# Patient Record
Sex: Female | Born: 1995 | Race: Black or African American | Hispanic: No | Marital: Single | Smoking: Current some day smoker
Health system: Southern US, Community
[De-identification: ages and names within clinical notes are randomized; demographics above are authoritative.]

## PROBLEM LIST (undated history)

## (undated) DIAGNOSIS — R4020 Unspecified coma: Secondary | ICD-10-CM

## (undated) HISTORY — DX: Unspecified coma: R40.20

---
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
## 2020-02-21 ENCOUNTER — Ambulatory Visit: Payer: Self-pay | Attending: Internal Medicine

## 2020-06-26 ENCOUNTER — Encounter (HOSPITAL_COMMUNITY): Payer: Self-pay | Admitting: Emergency Medicine

## 2020-06-26 ENCOUNTER — Other Ambulatory Visit: Payer: Self-pay

## 2020-06-26 ENCOUNTER — Ambulatory Visit (HOSPITAL_COMMUNITY)
Admission: EM | Admit: 2020-06-26 | Discharge: 2020-06-26 | Disposition: A | Discharge status: Home / Self Care | Payer: 59 | Attending: Family Medicine | Admitting: Family Medicine

## 2020-06-26 DIAGNOSIS — Z113 Encounter for screening for infections with a predominantly sexual mode of transmission: Secondary | ICD-10-CM | POA: Diagnosis present

## 2020-06-26 DIAGNOSIS — R300 Dysuria: Secondary | ICD-10-CM | POA: Diagnosis not present

## 2020-06-26 DIAGNOSIS — N3090 Cystitis, unspecified without hematuria: Secondary | ICD-10-CM | POA: Diagnosis not present

## 2020-06-26 DIAGNOSIS — Z3202 Encounter for pregnancy test, result negative: Secondary | ICD-10-CM

## 2020-06-26 LAB — POCT URINALYSIS DIPSTICK, ED / UC
Bilirubin Urine: NEGATIVE
Glucose, UA: 100 mg/dL — AB
Ketones, ur: NEGATIVE mg/dL
Nitrite: POSITIVE — AB
Protein, ur: >=300 mg/dL — AB
Specific Gravity, Urine: 1.025 (ref 1.005–1.030)
Urobilinogen, UA: 1 mg/dL (ref 0.0–1.0)
pH: 7 (ref 5.0–8.0)

## 2020-06-26 LAB — POC URINE PREG, ED: Preg Test, Ur: NEGATIVE

## 2020-06-26 MED ORDER — CEPHALEXIN 500 MG PO CAPS
500.0000 mg | ORAL_CAPSULE | Freq: Two times a day (BID) | ORAL | 0 refills | Status: DC
Start: 2020-06-26 — End: 2020-10-07

## 2020-06-26 NOTE — ED Provider Notes (Signed)
[facility]    CSN: [id number] Arrival date & time: 06/26/20  6384      History   Chief Complaint Chief Complaint  Patient presents with  . Dysuria    HPI Robin Lara is a 24 y.o. female.   HPI  Pleasant young woman.  Thinks she has a bladder infection.  Dysuria, frequency, and some blood in her urine.  She has had this before. No flank pain, abdominal pain, fever or chills.  No nausea or vomiting.  No history of kidney stones or kidney infection Patient states she is not pregnant She does not have any vaginal discharge but would like to be tested for STI infections while she is here  History reviewed. No pertinent past medical history.  There are no problems to display for this patient.   History reviewed. No pertinent surgical history.  OB History   No obstetric history on file.      Home Medications    Prior to Admission medications   Medication Sig Start Date End Date Taking? Authorizing Provider  Levonorgestrel (KYLEENA IU) by Intrauterine route.   Yes [provider]  cephALEXin (KEFLEX) 500 MG capsule Take 1 capsule (500 mg total) by mouth 2 (two) times daily. 06/26/20   Eustace Moore, MD    Family History Family History  Problem Relation Age of Onset  . Healthy Mother   . Healthy Father     Social History Social History   Tobacco Use  . Smoking status: Never Smoker  Substance Use Topics  . Alcohol use: No  . Drug use: Not on file     Allergies   Patient has no known allergies.   Review of Systems Review of Systems  See HPI Physical Exam Triage Vital Signs ED Triage Vitals  Enc Vitals Group     BP 06/26/20 0856 125/78     Pulse Rate 06/26/20 0856 88     Resp 06/26/20 0856 18     Temp 06/26/20 0856 98.5 F (36.9 C)     Temp Source 06/26/20 0856 Oral     SpO2 06/26/20 0856 99 %     Weight --      Height --      Head Circumference --      Peak Flow --      Pain Score 06/26/20 0854 0     Pain  Loc --      Pain Edu? --      Excl. in GC? --    No data found.  Updated Vital Signs BP 125/78 (BP Location: Right Arm)   Pulse 88   Temp 98.5 F (36.9 C) (Oral)   Resp 18   LMP 06/11/2020 (Approximate)   SpO2 99%      Physical Exam Constitutional:      General: She is not in acute distress.    Appearance: She is well-developed.  HENT:     Head: Normocephalic and atraumatic.     Mouth/Throat:     Comments: Mask is in place Eyes:     Conjunctiva/sclera: Conjunctivae normal.     Pupils: Pupils are equal, round, and reactive to light.  Cardiovascular:     Rate and Rhythm: Normal rate.  Pulmonary:     Effort: Pulmonary effort is normal. No respiratory distress.  Abdominal:     General: There is no distension.     Palpations: Abdomen is soft.     Tenderness: There is no abdominal tenderness. There is  no right CVA tenderness or left CVA tenderness.  Musculoskeletal:        General: Normal range of motion.     Cervical back: Normal range of motion.  Skin:    General: Skin is warm and dry.  Neurological:     Mental Status: She is alert.  Psychiatric:        Mood and Affect: Mood normal.        Behavior: Behavior normal.      UC Treatments / Results  Labs (all labs ordered are listed, but only abnormal results are displayed) Labs Reviewed  POCT URINALYSIS DIPSTICK, ED / UC - Abnormal; Notable for the following components:      Result Value   Glucose, UA 100 (*)    Hgb urine dipstick LARGE (*)    Protein, ur >=300 (*)    Nitrite POSITIVE (*)    Leukocytes,Ua LARGE (*)    All other components within normal limits  URINE CULTURE  POC URINE PREG, ED  CERVICOVAGINAL ANCILLARY ONLY    EKG   Radiology No results found.  Procedures Procedures (including critical care time)  Medications Ordered in UC Medications - No data to display  Initial Impression / Assessment and Plan / UC Course  I have reviewed the triage vital signs and the nursing  notes.  Pertinent labs & imaging results that were available during my care of the patient were reviewed by me and considered in my medical decision making (see chart for details).     Urine cultures pending.  Vaginal swab is pending.  I am going to start her on cephalexin 2 times a day.  Push fluids.  Check results in MyChart Final Clinical Impressions(s) / UC Diagnoses   Final diagnoses:  Cystitis  Screen for STD (sexually transmitted disease)     Discharge Instructions     Take antibiotic 2 times a day Drink plenty of water Check my chart for your swab results You will be called if any of your test results are positive   ED Prescriptions    Medication Sig Dispense Auth. Provider   cephALEXin (KEFLEX) 500 MG capsule Take 1 capsule (500 mg total) by mouth 2 (two) times daily. 10 capsule Eustace Moore, MD     PDMP not reviewed this encounter.   Eustace Moore, MD 06/26/20 [id number]

## 2020-06-26 NOTE — ED Triage Notes (Signed)
Present presents with dysuria, minimal blood in urine 5 days ago.   Denies discharge, itching. States she has been taking AZO since Monday. States did feel relief. Sx started 5 days ago.   States would like to be tested for UTI and STIs.

## 2020-06-26 NOTE — Discharge Instructions (Signed)
Take antibiotic 2 times a day Drink plenty of water Check my chart for your swab results You will be called if any of your test results are positive

## 2020-06-27 LAB — CERVICOVAGINAL ANCILLARY ONLY
Chlamydia: NEGATIVE
Comment: NEGATIVE
Comment: NEGATIVE
Comment: NORMAL
Neisseria Gonorrhea: NEGATIVE
Trichomonas: NEGATIVE

## 2020-06-28 LAB — URINE CULTURE
Culture: >=100000 — AB
Special Requests: NORMAL

## 2020-09-27 ENCOUNTER — Ambulatory Visit (HOSPITAL_COMMUNITY)
Admission: EM | Admit: 2020-09-27 | Discharge: 2020-09-27 | Disposition: A | Discharge status: Nursing Home (Includes ICF) | Payer: 59

## 2020-09-27 ENCOUNTER — Encounter (HOSPITAL_COMMUNITY): Payer: Self-pay

## 2020-09-27 ENCOUNTER — Emergency Department (HOSPITAL_COMMUNITY): Payer: 59

## 2020-09-27 ENCOUNTER — Other Ambulatory Visit: Payer: Self-pay

## 2020-09-27 ENCOUNTER — Emergency Department (HOSPITAL_COMMUNITY)
Admission: EM | Admit: 2020-09-27 | Discharge: 2020-09-27 | Disposition: A | Discharge status: Home / Self Care | Payer: 59 | Attending: Emergency Medicine | Admitting: Emergency Medicine

## 2020-09-27 DIAGNOSIS — R55 Syncope and collapse: Secondary | ICD-10-CM | POA: Insufficient documentation

## 2020-09-27 DIAGNOSIS — R4020 Unspecified coma: Secondary | ICD-10-CM

## 2020-09-27 LAB — URINALYSIS, ROUTINE W REFLEX MICROSCOPIC
Bilirubin Urine: NEGATIVE
Glucose, UA: NEGATIVE mg/dL
Hgb urine dipstick: NEGATIVE
Ketones, ur: NEGATIVE mg/dL
Leukocytes,Ua: NEGATIVE
Nitrite: NEGATIVE
Protein, ur: NEGATIVE mg/dL
Specific Gravity, Urine: 1.018 (ref 1.005–1.030)
pH: 6 (ref 5.0–8.0)

## 2020-09-27 LAB — I-STAT BETA HCG BLOOD, ED (MC, WL, AP ONLY): I-stat hCG, quantitative: <5 m[IU]/mL (ref <5)

## 2020-09-27 LAB — CBC
HCT: 36.1 % (ref 36.0–46.0)
Hemoglobin: 11.2 g/dL — ABNORMAL LOW (ref 12.0–15.0)
MCH: 23 pg — ABNORMAL LOW (ref 26.0–34.0)
MCHC: 31 g/dL (ref 30.0–36.0)
MCV: 74 fL — ABNORMAL LOW (ref 80.0–100.0)
Platelets: 265 10*3/uL (ref 150–400)
RBC: 4.88 MIL/uL (ref 3.87–5.11)
RDW: 15.3 % (ref 11.5–15.5)
WBC: 4.4 10*3/uL (ref 4.0–10.5)
nRBC: 0 % (ref 0.0–0.2)

## 2020-09-27 LAB — BASIC METABOLIC PANEL
Anion gap: 9 (ref 5–15)
BUN: 12 mg/dL (ref 6–20)
CO2: 25 mmol/L (ref 22–32)
Calcium: 9.1 mg/dL (ref 8.9–10.3)
Chloride: 103 mmol/L (ref 98–111)
Creatinine, Ser: 0.88 mg/dL (ref 0.44–1.00)
GFR, Estimated: >60 mL/min (ref >60)
Glucose, Bld: 93 mg/dL (ref 70–99)
Potassium: 4.1 mmol/L (ref 3.5–5.1)
Sodium: 137 mmol/L (ref 135–145)

## 2020-09-27 LAB — CBG MONITORING, ED: Glucose-Capillary: 89 mg/dL (ref 70–99)

## 2020-09-27 NOTE — ED Triage Notes (Signed)
Patient reports that she was at concert last night and family reports that she had syncopal event at the event, states that she hit her head on chair and now has head pain and back pain related to event. On assessment alert and oriented.

## 2020-09-27 NOTE — ED Provider Notes (Signed)
[facility] EMERGENCY DEPARTMENT Provider Note   CSN: [id number] Arrival date & time: 09/27/20  1059     History No chief complaint on file.   Robin Lara is a 24 y.o. female with no pertinent past medical history who presents today for evaluation after a syncopal event. Last night she was at a concert with her.  She states that she had "a few" mushrooms, and 2 drinks.  She states that that happened about 2 hours before she passed out.  About half an hour before hand she had smoked marijuana.  She states that the using mushrooms it is new but others are not.    With patient's permission I spoke with her aunt [place] [phone].  She reports that her back was towards the patient when she saw her fall.  She states that for under a minute patient had twitching of her whole body.  She was not confused after, did not loose control of bowel or bladder function.  She Hit her head on a chair and on the ground.  She was checked out by medical staff after who gave her water and said she was probably dehydrated.   Patient states that she went to urgent care.  She was referred here.  She reports that she has no seizure history.  She started to feel odd beforehand, light headed.  She denies any recent sickness.  No leg swelling, surgeries or immobilization.  No personal history of DVT/PE.  No recent long travels.  No hemoptysis.  She denies any chest pain.  No palpitations.  No shortness of breath.    HPI     History reviewed. No pertinent past medical history.  There are no problems to display for this patient.   History reviewed. No pertinent surgical history.   OB History   No obstetric history on file.     Family History  Problem Relation Age of Onset  . Healthy Mother   . Healthy Father     Social History   Tobacco Use  . Smoking status: Never Smoker  Substance Use Topics  . Alcohol use: No  . Drug use: Not on file    Home  Medications Prior to Admission medications   Medication Sig Start Date End Date Taking? Authorizing Provider  cephALEXin (KEFLEX) 500 MG capsule Take 1 capsule (500 mg total) by mouth 2 (two) times daily. 06/26/20   Eustace Moore, MD  Levonorgestrel (KYLEENA IU) by Intrauterine route.    [provider]    Allergies    Patient has no known allergies.  Review of Systems   Review of Systems  Constitutional: Negative for chills, fatigue and fever.  HENT: Negative for congestion.   Eyes: Negative for visual disturbance.  Respiratory: Negative for cough, choking and shortness of breath.   Cardiovascular: Negative for chest pain.  Gastrointestinal: Negative for abdominal pain, diarrhea, nausea and vomiting.  Genitourinary: Negative for dysuria.  Musculoskeletal: Negative for back pain and neck pain.  Skin: Negative for color change and wound.  Neurological: Positive for seizures, syncope and headaches. Negative for weakness.  Psychiatric/Behavioral: Negative for confusion.  All other systems reviewed and are negative.   Physical Exam Updated Vital Signs BP 122/72   Pulse 85   Temp 98.4 F (36.9 C) (Oral)   Resp 15   SpO2 100%   Physical Exam Vitals and nursing note reviewed.  Constitutional:      General: She is not in acute distress.  Appearance: She is well-developed. She is not ill-appearing.  HENT:     Head: Normocephalic and atraumatic.     Comments: No racoon eyes or battle signs bilaterally.  Unable to appreciate significant contusion.   Eyes:     Conjunctiva/sclera: Conjunctivae normal.  Cardiovascular:     Rate and Rhythm: Normal rate and regular rhythm.     Pulses: Normal pulses.     Heart sounds: Normal heart sounds. No murmur heard.   Pulmonary:     Effort: Pulmonary effort is normal. No respiratory distress.     Breath sounds: Normal breath sounds.  Abdominal:     Palpations: Abdomen is soft.     Tenderness: There is no abdominal  tenderness.  Musculoskeletal:     Cervical back: Normal range of motion and neck supple. Tenderness (Midline lower C-spine) present.     Right lower leg: No edema.     Left lower leg: No edema.  Skin:    General: Skin is warm and dry.  Neurological:     General: No focal deficit present.     Mental Status: She is alert and oriented to person, place, and time.     Cranial Nerves: No cranial nerve deficit.     Sensory: No sensory deficit.     Motor: No weakness.     Coordination: Coordination normal.  Psychiatric:        Mood and Affect: Mood normal.        Behavior: Behavior normal.     ED Results / Procedures / Treatments   Labs (all labs ordered are listed, but only abnormal results are displayed) Labs Reviewed  CBC - Abnormal; Notable for the following components:      Result Value   Hemoglobin 11.2 (*)    MCV 74.0 (*)    MCH 23.0 (*)    All other components within normal limits  BASIC METABOLIC PANEL  URINALYSIS, ROUTINE W REFLEX MICROSCOPIC  CBG MONITORING, ED  I-STAT BETA HCG BLOOD, ED ([facility], [facility], [facility] ONLY)    EKG EKG Interpretation  Date/Time:  Saturday September 27 2020 11:35:55 EST Ventricular Rate:  78 PR Interval:  146 QRS Duration: 78 QT Interval:  368 QTC Calculation: 419 R Axis:   69 Text Interpretation: Normal sinus rhythm Normal ECG No previous ECGs available Confirmed by Richardean Canal [phone]) on 09/27/2020 2:59:49 PM   Radiology CT Head Wo Contrast  Result Date: 09/27/2020 CLINICAL DATA:  Syncopal episode EXAM: CT HEAD WITHOUT CONTRAST CT CERVICAL SPINE WITHOUT CONTRAST TECHNIQUE: Multidetector CT imaging of the head and cervical spine was performed following the standard protocol without intravenous contrast. Multiplanar CT image reconstructions of the cervical spine were also generated. COMPARISON:  None. FINDINGS: CT HEAD FINDINGS Brain: No evidence of acute infarction, hemorrhage, hydrocephalus, extra-axial collection or mass lesion/mass effect.  Vascular: No hyperdense vessel or unexpected calcification. Skull: Normal. Negative for fracture or focal lesion. Sinuses/Orbits: Mucosal thickening of the LEFT sphenoid sinus Other: None. CT CERVICAL SPINE FINDINGS Alignment: Mild reversal of cervical lordosis. No evidence of spondylolisthesis. Skull base and vertebrae: No acute fracture. No primary bone lesion or focal pathologic process. Soft tissues and spinal canal: No prevertebral fluid or swelling. No visible canal hematoma. Disc levels:  No significant degenerative changes. Upper chest: Negative. Other: None IMPRESSION: 1. No acute intracranial abnormality. 2. No acute fracture or subluxation of the cervical spine. Electronically Signed   By: Meda Klinefelter MD   On: 09/27/2020 16:17   CT Cervical Spine  Wo Contrast  Result Date: 09/27/2020 CLINICAL DATA:  Syncopal episode EXAM: CT HEAD WITHOUT CONTRAST CT CERVICAL SPINE WITHOUT CONTRAST TECHNIQUE: Multidetector CT imaging of the head and cervical spine was performed following the standard protocol without intravenous contrast. Multiplanar CT image reconstructions of the cervical spine were also generated. COMPARISON:  None. FINDINGS: CT HEAD FINDINGS Brain: No evidence of acute infarction, hemorrhage, hydrocephalus, extra-axial collection or mass lesion/mass effect. Vascular: No hyperdense vessel or unexpected calcification. Skull: Normal. Negative for fracture or focal lesion. Sinuses/Orbits: Mucosal thickening of the LEFT sphenoid sinus Other: None. CT CERVICAL SPINE FINDINGS Alignment: Mild reversal of cervical lordosis. No evidence of spondylolisthesis. Skull base and vertebrae: No acute fracture. No primary bone lesion or focal pathologic process. Soft tissues and spinal canal: No prevertebral fluid or swelling. No visible canal hematoma. Disc levels:  No significant degenerative changes. Upper chest: Negative. Other: None IMPRESSION: 1. No acute intracranial abnormality. 2. No acute fracture or  subluxation of the cervical spine. Electronically Signed   By: Meda KlinefelterStephanie  Peacock MD   On: 09/27/2020 16:17    Procedures Procedures (including critical care time)  Medications Ordered in ED Medications - No data to display  ED Course  I have reviewed the triage vital signs and the nursing notes.  Pertinent labs & imaging results that were available during my care of the patient were reviewed by me and considered in my medical decision making (see chart for details).  No data found.     MDM Rules/Calculators/A&P                          Patient is a otherwise healthy 24 year old who presents today for evaluation after a syncopal event.  This happened last night when she was at a concert after she had reportedly 2 shots of alcohol, smoke marijuana and used mushrooms.  On exam she is generally well-appearing.  EKG obtained without significant arrhythmia or ischemia.  Labs obtained and reviewed, she has mild anemia however not enough that I suspect it to be clinically significant or related to her syncopal event. With patient's permission I spoke with her aunt who reports that patient had twitching motions during the syncopal event.    With a possible first-time seizure-like activity versus seizure from striking her head and midline neck pain CT head and neck are obtained without evidence of intracranial hemorrhage, intracranial mass, or acute abnormalities.  Patient is given seizure/syncope precautions.  She is instructed that she should not drive until cleared to do so by neurology.  We additionally discussed avoiding other potentially dangerous activities where she may cause harm to herself or someone else if she were to lose consciousness or have a seizure.  She states her understanding of these instructions and precautions.  Referral placed for ambulatory referral for neurology.  Return precautions were discussed with patient who states their understanding.  At the time of discharge  patient denied any unaddressed complaints or concerns.  Patient is agreeable for discharge home.  Note: Portions of this report may have been transcribed using voice recognition software. Every effort was made to ensure accuracy; however, inadvertent computerized transcription errors may be present.  Final Clinical Impression(s) / ED Diagnoses Final diagnoses:  Loss of consciousness (HCC)    Rx / DC Orders ED Discharge Orders         Ordered    Ambulatory referral to Neurology       Comments: An appointment is requested in approximately:  2 weeks   09/27/20 1633           Cristina Gong, PA-C 09/27/20 1704    Charlynne Pander, MD 09/27/20 [phone]

## 2020-09-27 NOTE — Discharge Instructions (Signed)
Today your blood work and CT scan on your head and neck was overall reassuring. Given that you had twitching movements I would recommend that you follow-up with neurology. Do not drive, operate heavy machinery or perform other potentially dangerous tasks until you are cleared to do so due to concern of this being a possibly first-time seizure. Do not swim, or bathe in a tub.  Do not work at heights or perform any activity where if you pass out or have a seizure you may cause harm to your self or anyone else.    Please take Tylenol (acetaminophen) to relieve your pain.  You may take tylenol, up to 1,000 mg (two extra strength pills).  Do not take more than 3,000 mg tylenol in a 24 hour period.  Please check all medication labels as many medications such as pain and cold medications may contain tylenol. Please do not drink alcohol while taking this medication.

## 2020-09-27 NOTE — ED Notes (Signed)
Pt reports having syncopal episode at concert last night and "doesn't remember anything" about incident.  States was checked by paramedic at concert.  Denies any drug use; states had had "a couple shots" of liquor.  Discussed with Colin Mulders, NP - informed pt if she had syncopal episode without memory of incident, needs to be seen in ED.  Pt verbalized understanding.

## 2020-10-07 ENCOUNTER — Encounter: Payer: Self-pay | Admitting: Diagnostic Neuroimaging

## 2020-10-07 ENCOUNTER — Other Ambulatory Visit: Payer: Self-pay

## 2020-10-07 ENCOUNTER — Ambulatory Visit: Payer: 59 | Admitting: Diagnostic Neuroimaging

## 2020-10-07 VITALS — BP 123/82 | HR 79 | Ht 65.0 in | Wt 155.0 lb

## 2020-10-07 DIAGNOSIS — R55 Syncope and collapse: Secondary | ICD-10-CM

## 2020-10-07 NOTE — Progress Notes (Signed)
[facility]  PATIENT: Robin Lara DOB: 03/21/1996  REFERRING CLINICIAN: Cristina Gong, [place]* HISTORY FROM: patient  REASON FOR VISIT: new consult    HISTORICAL  CHIEF COMPLAINT:  Chief Complaint  Patient presents with  . Loss of Consciousness    rm 6 New Pt "09/27/20 loss of consciousness, hit head after going to concert, ongoing headache since"     HISTORY OF PRESENT ILLNESS:   24 year old female here for evaluation of syncope.  09/26/20 patient was at an indoor concert for about 1 hour when she began to feel lightheaded and dizzy.  Apparently she collapsed to the ground and lost consciousness for about 30 seconds.  She may have hit her head.  She had some brief twitching movements.  She woke up rapidly without postictal confusion.  No tongue biting or incontinence.  She drank some water and was attended to by paramedics.  She was able to stay at venue for the remainder of the concert.  Before she passed out she had 2 shots of alcohol, some cannabis and some mushrooms.  Patient had not eaten or drank that much earlier in the day.  Next day patient had some ongoing headaches and therefore was taken to the emergency room for evaluation.  CT of the head and neck were unremarkable.  Lab testing unremarkable.  No prior similar events.  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    REVIEW OF SYSTEMS: Full 14 system review of systems performed and negative with exception of: As per HPI.  ALLERGIES: No Known Allergies  HOME MEDICATIONS: Outpatient Medications Prior to Visit  Medication Sig Dispense Refill  . Levonorgestrel (KYLEENA IU) by Intrauterine route.    . cephALEXin (KEFLEX) 500 MG capsule Take 1 capsule (500 mg total) by mouth 2 (two) times daily. 10 capsule 0   No facility-administered medications prior to visit.    PAST MEDICAL HISTORY: Past Medical History:  Diagnosis Date  . Loss of consciousness  (HCC)     PAST SURGICAL HISTORY: No past surgical history on file.  FAMILY HISTORY: Family History  Problem Relation Age of Onset  . Healthy Mother   . Healthy Father     SOCIAL HISTORY: Social History   Socioeconomic History  . Marital status: Single    Spouse name: Not on file  . Number of children: 0  . Years of education: Not on file  . Highest education level: Bachelor's degree (e.g., BA, AB, BS)  Occupational History    Comment: grad school  Tobacco Use  . Smoking status: Current Some Day Smoker  . Smokeless tobacco: Never Used  Substance and Sexual Activity  . Alcohol use: Yes    Comment: 10/07/20 daily  . Drug use: Yes    Types: Marijuana    Comment:  10/07/20 daily  . Sexual activity: Not on file  Other Topics Concern  . Not on file  Social History Narrative   Caffeine- maybe 1 green tea or coffee once a week   Social Determinants of Health   Financial Resource Strain:   . Difficulty of Paying Living Expenses: Not on file  Food Insecurity:   . Worried About Programme researcher, broadcasting/film/video in the Last Year: Not on file  . Ran Out of Food in the Last Year: Not on file  Transportation Needs:   . Lack of Transportation (Medical): Not on file  . Lack of Transportation (Non-Medical): Not on file  Physical Activity:   . Days of Exercise per Week: Not on file  . Minutes of Exercise per Session: Not on file  Stress:   . Feeling of Stress : Not on file  Social Connections:   . Frequency of Communication with Friends and Family: Not on file  . Frequency of Social Gatherings with Friends and Family: Not on file  . Attends Religious Services: Not on file  . Active Member of Clubs or Organizations: Not on file  . Attends Banker Meetings: Not on file  . Marital Status: Not on file  Intimate Partner Violence:   . Fear of Current or Ex-Partner: Not on file  . Emotionally Abused: Not on file  . Physically Abused: Not on file  . Sexually Abused: Not on file      PHYSICAL EXAM  GENERAL EXAM/CONSTITUTIONAL: Vitals:  Vitals:   10/07/20 1113  BP: 123/82  Pulse: 79  Weight: 155 lb (70.3 kg)  Height: 5\' 5"  (1.651 m)     Body mass index is 25.79 kg/m. Wt Readings from Last 3 Encounters:  10/07/20 155 lb (70.3 kg)  06/18/14 137 lb (62.1 kg) (72 %, Z= 0.59)*  08/01/13 130 lb 12.8 oz (59.3 kg) (66 %, Z= 0.43)*   * Growth percentiles are based on CDC (Girls, 2-20 Years) data.     Patient is in no distress; well developed, nourished and groomed; neck is supple  CARDIOVASCULAR:  Examination of carotid arteries is normal; no carotid bruits  Regular rate and rhythm, no murmurs  Examination of peripheral vascular system by observation and palpation is normal  EYES:  Ophthalmoscopic exam of optic discs and posterior segments is normal; no papilledema or hemorrhages  No exam data present  MUSCULOSKELETAL:  Gait, strength, tone, movements noted in Neurologic exam below  NEUROLOGIC: MENTAL STATUS:  No flowsheet data found.  awake, alert, oriented to person, place and time  recent and remote memory intact  normal attention and concentration  language fluent, comprehension intact, naming intact  fund of knowledge appropriate  CRANIAL NERVE:   2nd - no papilledema on fundoscopic exam  2nd, 3rd, 4th, 6th - pupils equal and reactive to light, visual fields full to confrontation, extraocular muscles intact, no nystagmus  5th - facial sensation symmetric  7th - facial strength symmetric  8th - hearing intact  9th - palate elevates symmetrically, uvula midline  11th - shoulder shrug symmetric  12th - tongue protrusion midline  MOTOR:   normal bulk and tone, full strength in the BUE, BLE  SENSORY:   normal and symmetric to light touch, temperature, vibration  COORDINATION:   finger-nose-finger, fine finger movements normal  REFLEXES:   deep tendon reflexes present and symmetric  GAIT/STATION:   narrow  based gait     DIAGNOSTIC DATA (LABS, IMAGING, TESTING) - I reviewed patient records, labs, notes, testing and imaging myself where available.  Lab Results  Component Value Date   WBC 4.4 09/27/2020   HGB 11.2 (L) 09/27/2020   HCT 36.1 09/27/2020   MCV 74.0 (L) 09/27/2020   PLT 265 09/27/2020      Component Value Date/Time   NA 137 09/27/2020 1134   K 4.1 09/27/2020 1134   CL 103 09/27/2020 1134   CO2 25 09/27/2020 1134   GLUCOSE 93 09/27/2020 1134   BUN 12 09/27/2020 1134   CREATININE 0.88 09/27/2020 1134   CALCIUM 9.1 09/27/2020 1134   GFRNONAA >60 09/27/2020 1134   No results found for: CHOL,  HDL, LDLCALC, LDLDIRECT, TRIG, CHOLHDL No results found for: GEXB2W No results found for: VITAMINB12 No results found for: TSH   09/27/20 CT head / cervical  1. No acute intracranial abnormality. 2. No acute fracture or subluxation of the cervical spine.    ASSESSMENT AND PLAN  24 y.o. year old female here with:  Dx:  1. Syncope, unspecified syncope type     PLAN:  SYNCOPE (09/26/20; likely provoked due to decreased PO intake earlier that day; then some etoh, THC and mushroom prior to concert; doubt seizure due to prodromal dizziness and lack of post-ictal confusion; minor twitching can be seen with syncope events) - monitor symptoms - caution with driving; strict 55-month waiting period before driving restriction could be reduced due to likely provoked nature of this event - refer to cardiology evaluation (rule out cardiac etiologies)  Orders Placed This Encounter  Procedures  . Ambulatory referral to Cardiology   Return for return to PCP, pending if symptoms worsen or fail to improve.    Suanne Marker, MD 10/07/2020, 11:39 AM Certified in Neurology, Neurophysiology and Neuroimaging  [facility] [address], [address] [place], [place] [address] [phone]

## 2020-10-07 NOTE — Patient Instructions (Signed)
SYNCOPE (likely provoked due to lack of food / water intake that day, some etoh, THC and mushroom at concert; doubt seizure due to prodromal dizziness and lack of post-ictal confusion; minor twitching can be seen with syncope events) - monitor symptoms - refer to cardiology follow up

## 2020-10-17 ENCOUNTER — Other Ambulatory Visit: Payer: Self-pay

## 2020-10-17 ENCOUNTER — Ambulatory Visit (HOSPITAL_COMMUNITY)
Admission: EM | Admit: 2020-10-17 | Discharge: 2020-10-17 | Disposition: A | Discharge status: Home / Self Care | Payer: 59 | Attending: Emergency Medicine | Admitting: Emergency Medicine

## 2020-10-17 ENCOUNTER — Encounter (HOSPITAL_COMMUNITY): Payer: Self-pay | Admitting: Emergency Medicine

## 2020-10-17 DIAGNOSIS — Z20822 Contact with and (suspected) exposure to covid-19: Secondary | ICD-10-CM | POA: Diagnosis not present

## 2020-10-17 DIAGNOSIS — J069 Acute upper respiratory infection, unspecified: Secondary | ICD-10-CM

## 2020-10-17 MED ORDER — BENZONATATE 100 MG PO CAPS: 100.0000 mg | ORAL_CAPSULE | Freq: Three times a day (TID) | ORAL | 0 refills | Status: DC | PRN

## 2020-10-17 NOTE — ED Provider Notes (Signed)
[facility]    CSN: [id number] Arrival date & time: 10/17/20  1122      History   Chief Complaint Chief Complaint  Patient presents with  . Nasal Congestion  . Covid Exposure    HPI Robin Lara is a 24 y.o. female.   Robin Lara presents with complaints of congestion, scratchy throat started earlier this week, and now more recently developed a cough. Productive of phlegm. She found out that someone she works with tested positive for covid-19 earlier this week. She has been vaccinated for covid. No gi symptoms. No headache or body aches. Has been taking mucinex and alka seltzer over the counter which have helped some.   ROS per HPI, negative if not otherwise mentioned.      Past Medical History:  Diagnosis Date  . Loss of consciousness (HCC)     There are no problems to display for this patient.   History reviewed. No pertinent surgical history.  OB History   No obstetric history on file.      Home Medications    Prior to Admission medications   Medication Sig Start Date End Date Taking? Authorizing Provider  benzonatate (TESSALON) 100 MG capsule Take 1-2 capsules (100-200 mg total) by mouth 3 (three) times daily as needed for cough. 10/17/20   Georgetta Haber, NP  Levonorgestrel (KYLEENA IU) by Intrauterine route.    [provider]    Family History Family History  Problem Relation Age of Onset  . Healthy Mother   . Healthy Father     Social History Social History   Tobacco Use  . Smoking status: Current Some Day Smoker    Types: Cigarettes  . Smokeless tobacco: Never Used  Substance Use Topics  . Alcohol use: Yes    Comment: 10/07/20 daily  . Drug use: Yes    Types: Marijuana    Comment:  10/07/20 daily     Allergies   Patient has no known allergies.   Review of Systems Review of Systems   Physical Exam Triage Vital Signs ED Triage Vitals  Enc Vitals Group     BP 10/17/20 1243 (!) 145/93      Pulse Rate 10/17/20 1243 85     Resp 10/17/20 1243 16     Temp 10/17/20 1243 98.3 F (36.8 C)     Temp Source 10/17/20 1243 Oral     SpO2 10/17/20 1243 97 %     Weight --      Height --      Head Circumference --      Peak Flow --      Pain Score 10/17/20 1241 0     Pain Loc --      Pain Edu? --      Excl. in GC? --    No data found.  Updated Vital Signs BP (!) 145/93 (BP Location: Right Arm)   Pulse 85   Temp 98.3 F (36.8 C) (Oral)   Resp 16   LMP  (LMP Unknown)   SpO2 97%   Visual Acuity Right Eye Distance:   Left Eye Distance:   Bilateral Distance:    Right Eye Near:   Left Eye Near:    Bilateral Near:     Physical Exam Constitutional:      General: She is not in acute distress.    Appearance: She is well-developed.  HENT:     Head: Normocephalic and atraumatic.     Right Ear:  Tympanic membrane, ear canal and external ear normal.     Left Ear: Tympanic membrane, ear canal and external ear normal.     Nose: Rhinorrhea present.     Mouth/Throat:     Pharynx: Uvula midline.     Tonsils: No tonsillar exudate.  Eyes:     Conjunctiva/sclera: Conjunctivae normal.     Pupils: Pupils are equal, round, and reactive to light.  Cardiovascular:     Rate and Rhythm: Normal rate and regular rhythm.     Heart sounds: Normal heart sounds.  Pulmonary:     Effort: Pulmonary effort is normal.     Breath sounds: Normal breath sounds.  Skin:    General: Skin is warm and dry.  Neurological:     Mental Status: She is alert and oriented to person, place, and time.      UC Treatments / Results  Labs (all labs ordered are listed, but only abnormal results are displayed) Labs Reviewed  SARS CORONAVIRUS 2 (TAT 6-24 HRS)    EKG   Radiology No results found.  Procedures Procedures (including critical care time)  Medications Ordered in UC Medications - No data to display  Initial Impression / Assessment and Plan / UC Course  I have reviewed the triage vital  signs and the nursing notes.  Pertinent labs & imaging results that were available during my care of the patient were reviewed by me and considered in my medical decision making (see chart for details).     Non toxic. Benign physical exam.  History and physical consistent with viral illness.  Covid testing pending and isolation instructions provided.  Supportive cares recommended. Return precautions provided. Patient verbalized understanding and agreeable to plan.   Final Clinical Impressions(s) / UC Diagnoses   Final diagnoses:  Upper respiratory tract infection, unspecified type     Discharge Instructions     Push fluids to ensure adequate hydration and keep secretions thin. . Tylenol and/or ibuprofen as needed for pain or fevers.  Continue with mucinex or combination medications as needed for symptoms such as dayquil/ nyquil.  Tessalon as needed for cough.  Self isolate until covid results are back and negative.  Will notify you by phone of any positive findings. Your negative results will be sent through your MyChart.     If symptoms worsen or do not improve in the next week to return to be seen or to follow up with your PCP.     ED Prescriptions    Medication Sig Dispense Auth. Provider   benzonatate (TESSALON) 100 MG capsule Take 1-2 capsules (100-200 mg total) by mouth 3 (three) times daily as needed for cough. 21 capsule Georgetta Haber, NP     PDMP not reviewed this encounter.   Georgetta Haber, NP 10/17/20 1459

## 2020-10-17 NOTE — Discharge Instructions (Signed)
Push fluids to ensure adequate hydration and keep secretions thin. . Tylenol and/or ibuprofen as needed for pain or fevers.  Continue with mucinex or combination medications as needed for symptoms such as dayquil/ nyquil.  Tessalon as needed for cough.  Self isolate until covid results are back and negative.  Will notify you by phone of any positive findings. Your negative results will be sent through your MyChart.     If symptoms worsen or do not improve in the next week to return to be seen or to follow up with your PCP.

## 2020-10-17 NOTE — ED Triage Notes (Signed)
Patient c/o cough, nasal congestion, and scratchy throat x 3-4 days.   Patient denies fever or SOB.   Patient has taken Mucinex w/ some relief of symptoms.   Patient endorses that a coworker tested positive for COVID-19.

## 2020-10-18 LAB — SARS CORONAVIRUS 2 (TAT 6-24 HRS): SARS Coronavirus 2: NEGATIVE

## 2020-10-29 ENCOUNTER — Ambulatory Visit: Payer: 59 | Admitting: Internal Medicine

## 2020-12-16 ENCOUNTER — Ambulatory Visit
Admission: EM | Admit: 2020-12-16 | Discharge: 2020-12-16 | Disposition: A | Discharge status: Home / Self Care | Payer: 59 | Attending: Emergency Medicine | Admitting: Emergency Medicine

## 2020-12-16 ENCOUNTER — Other Ambulatory Visit: Payer: Self-pay

## 2020-12-16 ENCOUNTER — Encounter: Payer: Self-pay | Admitting: Emergency Medicine

## 2020-12-16 ENCOUNTER — Ambulatory Visit: Payer: Self-pay

## 2020-12-16 DIAGNOSIS — N390 Urinary tract infection, site not specified: Secondary | ICD-10-CM | POA: Diagnosis not present

## 2020-12-16 LAB — POCT URINALYSIS DIP (MANUAL ENTRY)
Bilirubin, UA: NEGATIVE
Glucose, UA: NEGATIVE mg/dL
Ketones, POC UA: NEGATIVE mg/dL
Nitrite, UA: NEGATIVE
Protein Ur, POC: 30 mg/dL — AB
Spec Grav, UA: 1.025 (ref 1.010–1.025)
Urobilinogen, UA: 0.2 E.U./dL
pH, UA: 7.5 (ref 5.0–8.0)

## 2020-12-16 LAB — POCT URINE PREGNANCY: Preg Test, Ur: NEGATIVE

## 2020-12-16 MED ORDER — NITROFURANTOIN MONOHYD MACRO 100 MG PO CAPS: 100.0000 mg | ORAL_CAPSULE | Freq: Two times a day (BID) | ORAL | 0 refills | Status: AC

## 2020-12-16 NOTE — ED Triage Notes (Signed)
Pt said having painful uriantion x 2 days, says she feels like she cant empty her bladder.  no discharge, some discomfort. Wanting to be checked for STD as well. No symptoms.

## 2020-12-16 NOTE — ED Provider Notes (Signed)
[facility]    CSN: [id number] Arrival date & time: 12/16/20  1354      History   Chief Complaint Chief Complaint  Patient presents with  . Urinary Tract Infection    HPI Robin Lara is a 25 y.o. female presenting today for evaluation of UTI.  Reports dysuria, urgency and frequency over the past 1 to 2 days.  Feels continued urge to urinate and incomplete voiding despite going to the bathroom.  Denies any vaginal discharge itching or irritation.  Reports recurrent history of UTI.  Denies any vaginal discharge itching or irritation.  Has IUD in place.    HPI  Past Medical History:  Diagnosis Date  . Loss of consciousness (HCC)     There are no problems to display for this patient.   History reviewed. No pertinent surgical history.  OB History   No obstetric history on file.      Home Medications    Prior to Admission medications   Medication Sig Start Date End Date Taking? Authorizing Provider  nitrofurantoin, macrocrystal-monohydrate, (MACROBID) 100 MG capsule Take 1 capsule (100 mg total) by mouth 2 (two) times daily for 5 days. 12/16/20 12/21/20 Yes Wieters, Hallie C, PA-C  benzonatate (TESSALON) 100 MG capsule Take 1-2 capsules (100-200 mg total) by mouth 3 (three) times daily as needed for cough. 10/17/20   Georgetta Haber, NP  Levonorgestrel (KYLEENA IU) by Intrauterine route.    [provider]    Family History Family History  Problem Relation Age of Onset  . Healthy Mother   . Healthy Father     Social History Social History   Tobacco Use  . Smoking status: Current Some Day Smoker    Types: Cigarettes  . Smokeless tobacco: Never Used  Substance Use Topics  . Alcohol use: Yes    Comment: 10/07/20 daily  . Drug use: Yes    Types: Marijuana    Comment:  10/07/20 daily     Allergies   Patient has no known allergies.   Review of Systems Review of Systems  Constitutional: Negative for fever.  Respiratory: Negative  for shortness of breath.   Cardiovascular: Negative for chest pain.  Gastrointestinal: Negative for abdominal pain, diarrhea, nausea and vomiting.  Genitourinary: Positive for dysuria, frequency and urgency. Negative for flank pain, genital sores, hematuria, menstrual problem, vaginal bleeding, vaginal discharge and vaginal pain.  Musculoskeletal: Negative for back pain.  Skin: Negative for rash.  Neurological: Negative for dizziness, light-headedness and headaches.     Physical Exam Triage Vital Signs ED Triage Vitals  Enc Vitals Group     BP 12/16/20 1430 109/70     Pulse Rate 12/16/20 1430 88     Resp 12/16/20 1430 16     Temp 12/16/20 1430 98.5 F (36.9 C)     Temp Source 12/16/20 1430 Oral     SpO2 12/16/20 1430 98 %     Weight --      Height --      Head Circumference --      Peak Flow --      Pain Score 12/16/20 1432 7     Pain Loc --      Pain Edu? --      Excl. in GC? --    No data found.  Updated Vital Signs BP 109/70 (BP Location: Right Arm)   Pulse 88   Temp 98.5 F (36.9 C) (Oral)   Resp 16   SpO2 98%  Visual Acuity Right Eye Distance:   Left Eye Distance:   Bilateral Distance:    Right Eye Near:   Left Eye Near:    Bilateral Near:     Physical Exam Vitals and nursing note reviewed.  Constitutional:      Appearance: She is well-developed and well-nourished.     Comments: No acute distress  HENT:     Head: Normocephalic and atraumatic.     Nose: Nose normal.  Eyes:     Conjunctiva/sclera: Conjunctivae normal.  Cardiovascular:     Rate and Rhythm: Normal rate.  Pulmonary:     Effort: Pulmonary effort is normal. No respiratory distress.  Abdominal:     General: There is no distension.  Musculoskeletal:        General: Normal range of motion.     Cervical back: Neck supple.  Skin:    General: Skin is warm and dry.  Neurological:     Mental Status: She is alert and oriented to person, place, and time.  Psychiatric:        Mood and  Affect: Mood and affect normal.      UC Treatments / Results  Labs (all labs ordered are listed, but only abnormal results are displayed) Labs Reviewed  POCT URINALYSIS DIP (MANUAL ENTRY) - Abnormal; Notable for the following components:      Result Value   Blood, UA moderate (*)    Protein Ur, POC =30 (*)    Leukocytes, UA Small (1+) (*)    All other components within normal limits  URINE CULTURE  POCT URINE PREGNANCY  CERVICOVAGINAL ANCILLARY ONLY    EKG   Radiology No results found.  Procedures Procedures (including critical care time)  Medications Ordered in UC Medications - No data to display  Initial Impression / Assessment and Plan / UC Course  I have reviewed the triage vital signs and the nursing notes.  Pertinent labs & imaging results that were available during my care of the patient were reviewed by me and considered in my medical decision making (see chart for details).     UA with small leuks and moderate hemoglobin, sending for culture, will empirically treat for UTI with Macrobid twice daily x5 days, vaginal swab pending also screen for STDs and any vaginal causes contributing symptoms.  Push fluids.  Alter therapy as needed.  Discussed strict return precautions. Patient verbalized understanding and is agreeable with plan.  Final Clinical Impressions(s) / UC Diagnoses   Final diagnoses:  Lower urinary tract infection, acute     Discharge Instructions     Begin Macrobid twice daily for 5 days to treat UTI Urine culture pending Vaginal swab pending Drink clear water Follow-up if not improving or worsening    ED Prescriptions    Medication Sig Dispense Auth. Provider   nitrofurantoin, macrocrystal-monohydrate, (MACROBID) 100 MG capsule Take 1 capsule (100 mg total) by mouth 2 (two) times daily for 5 days. 10 capsule Wieters, [place] C, PA-C     PDMP not reviewed this encounter.   Lew Dawes, [place] 12/16/20 1540

## 2020-12-16 NOTE — Discharge Instructions (Signed)
Begin Macrobid twice daily for 5 days to treat UTI Urine culture pending Vaginal swab pending Drink clear water Follow-up if not improving or worsening

## 2020-12-17 ENCOUNTER — Telehealth (HOSPITAL_COMMUNITY): Payer: Self-pay | Admitting: Emergency Medicine

## 2020-12-17 LAB — CERVICOVAGINAL ANCILLARY ONLY
Bacterial Vaginitis (gardnerella): POSITIVE — AB
Candida Glabrata: NEGATIVE
Candida Vaginitis: POSITIVE — AB
Chlamydia: NEGATIVE
Comment: NEGATIVE
Comment: NEGATIVE
Comment: NEGATIVE
Comment: NEGATIVE
Comment: NEGATIVE
Comment: NORMAL
Neisseria Gonorrhea: NEGATIVE
Trichomonas: NEGATIVE

## 2020-12-17 MED ORDER — METRONIDAZOLE 500 MG PO TABS: 500.0000 mg | ORAL_TABLET | Freq: Two times a day (BID) | ORAL | 0 refills | Status: DC

## 2020-12-17 MED ORDER — FLUCONAZOLE 150 MG PO TABS
150.0000 mg | ORAL_TABLET | Freq: Once | ORAL | 0 refills | Status: AC
Start: 2020-12-17 — End: 2020-12-17

## 2020-12-18 LAB — URINE CULTURE

## 2021-02-09 ENCOUNTER — Encounter (HOSPITAL_COMMUNITY): Payer: Self-pay

## 2021-02-09 ENCOUNTER — Ambulatory Visit (HOSPITAL_COMMUNITY)
Admission: EM | Admit: 2021-02-09 | Discharge: 2021-02-09 | Disposition: A | Discharge status: Home / Self Care | Payer: 59 | Attending: Urgent Care | Admitting: Urgent Care

## 2021-02-09 ENCOUNTER — Other Ambulatory Visit: Payer: Self-pay

## 2021-02-09 DIAGNOSIS — Z7251 High risk heterosexual behavior: Secondary | ICD-10-CM | POA: Insufficient documentation

## 2021-02-09 DIAGNOSIS — N760 Acute vaginitis: Secondary | ICD-10-CM | POA: Diagnosis not present

## 2021-02-09 DIAGNOSIS — N3001 Acute cystitis with hematuria: Secondary | ICD-10-CM | POA: Insufficient documentation

## 2021-02-09 DIAGNOSIS — R300 Dysuria: Secondary | ICD-10-CM

## 2021-02-09 DIAGNOSIS — R350 Frequency of micturition: Secondary | ICD-10-CM | POA: Insufficient documentation

## 2021-02-09 DIAGNOSIS — Z113 Encounter for screening for infections with a predominantly sexual mode of transmission: Secondary | ICD-10-CM | POA: Diagnosis not present

## 2021-02-09 LAB — POCT URINALYSIS DIPSTICK, ED / UC
Glucose, UA: 500 mg/dL — AB
Ketones, ur: 15 mg/dL — AB
Nitrite: POSITIVE — AB
Protein, ur: >=300 mg/dL — AB
Specific Gravity, Urine: 1.02 (ref 1.005–1.030)
Urobilinogen, UA: 4 mg/dL — ABNORMAL HIGH (ref 0.0–1.0)
pH: 5.5 (ref 5.0–8.0)

## 2021-02-09 LAB — POC URINE PREG, ED: Preg Test, Ur: NEGATIVE

## 2021-02-09 MED ORDER — CEPHALEXIN 500 MG PO CAPS: 500.0000 mg | ORAL_CAPSULE | Freq: Two times a day (BID) | ORAL | 0 refills | Status: DC

## 2021-02-09 NOTE — ED Triage Notes (Signed)
Pt presents with urinary frequency and pain with urination since waking up this morning.
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
## 2021-02-09 NOTE — Discharge Instructions (Addendum)

## 2021-02-11 LAB — URINE CULTURE: Culture: NO GROWTH

## 2021-02-12 LAB — CERVICOVAGINAL ANCILLARY ONLY
Bacterial Vaginitis (gardnerella): POSITIVE — AB
Candida Glabrata: NEGATIVE
Candida Vaginitis: NEGATIVE
Chlamydia: NEGATIVE
Comment: NEGATIVE
Comment: NEGATIVE
Comment: NEGATIVE
Comment: NEGATIVE
Comment: NEGATIVE
Comment: NORMAL
Neisseria Gonorrhea: NEGATIVE
Trichomonas: NEGATIVE

## 2021-02-13 ENCOUNTER — Telehealth (HOSPITAL_COMMUNITY): Payer: Self-pay | Admitting: Emergency Medicine

## 2021-02-13 MED ORDER — METRONIDAZOLE 500 MG PO TABS: 500.0000 mg | ORAL_TABLET | Freq: Two times a day (BID) | ORAL | 0 refills | Status: DC

## 2021-09-15 ENCOUNTER — Ambulatory Visit: Payer: 59 | Admitting: Podiatry

## 2021-09-17 ENCOUNTER — Other Ambulatory Visit: Payer: Self-pay

## 2021-09-17 ENCOUNTER — Ambulatory Visit: Payer: 59 | Admitting: Podiatry

## 2021-09-17 VITALS — BP 124/73 | HR 86 | Temp 97.9°F

## 2021-09-17 DIAGNOSIS — M79675 Pain in left toe(s): Secondary | ICD-10-CM

## 2021-09-17 DIAGNOSIS — L600 Ingrowing nail: Secondary | ICD-10-CM | POA: Diagnosis not present

## 2021-09-17 MED ORDER — CEPHALEXIN 500 MG PO CAPS: 500.0000 mg | ORAL_CAPSULE | Freq: Three times a day (TID) | ORAL | 0 refills | Status: AC

## 2021-09-17 NOTE — Patient Instructions (Signed)

## 2021-09-20 NOTE — Progress Notes (Signed)
Subjective:   Patient ID: Robin Lara, female   DOB: 25 y.o.   MRN: [id number]   HPI 25 year old female presents the office today for concerns of ingrown toenail left big toe, medial aspect.  Onset was about 2 weeks ago but she states this has been a reoccurring issue.  She typically get a pedicure and they will trim out the ingrown toenail but she thinks that it is too ingrown for them to get out.  She has not had any pus.  Area is tender with pressure it has been red and swollen.   Review of Systems  All other systems reviewed and are negative.  Past Medical History:  Diagnosis Date   Loss of consciousness (HCC)     No past surgical history on file.   Current Outpatient Medications:    cephALEXin (KEFLEX) 500 MG capsule, Take 1 capsule (500 mg total) by mouth 3 (three) times daily., Disp: 21 capsule, Rfl: 0   etonogestrel-ethinyl estradiol (NUVARING) 0.12-0.015 MG/24HR vaginal ring, SMARTSIG:1 Ring Vaginal Once a Month, Disp: , Rfl:   No Known Allergies       Objective:  Physical Exam  General: AAO x3, NAD  Dermatological: Incurvation present to the left hallux, medial aspect with localized edema and erythema.  No ascending cellulitis.  There is no purulence.  Tenderness palpation along nail border.  No open lesions.  Vascular: Dorsalis Pedis artery and Posterior Tibial artery pedal pulses are 2/4 bilateral with immedate capillary fill time. There is no pain with calf compression, swelling, warmth, erythema.   Neruologic: Grossly intact via light touch bilateral.   Musculoskeletal: Tenderness on the ingrown toenail left medial hallux.  No other areas of discomfort.  Muscular strength 5/5 in all groups tested bilateral.  Gait: Unassisted, Nonantalgic.       Assessment:   Left medial hallux ingrown toenail     Plan:  -Treatment options discussed including all alternatives, risks, and complications -Etiology of symptoms were discussed -At this time, the  patient is requesting partial nail removal with chemical matricectomy to the symptomatic portion of the nail. Risks and complications were discussed with the patient for which they understand and written consent was obtained. Under sterile conditions a total of 3 mL of a mixture of 2% lidocaine plain and 0.5% Marcaine plain was infiltrated in a hallux block fashion. Once anesthetized, the skin was prepped in sterile fashion. A tourniquet was then applied. Next the medial aspect of hallux nail border was then sharply excised making sure to remove the entire offending nail border. Once the nails were ensured to be removed area was debrided and the underlying skin was intact. There is no purulence identified in the procedure. Next phenol was then applied under standard conditions and copiously irrigated. Silvadene was applied. A dry sterile dressing was applied. After application of the dressing the tourniquet was removed and there is found to be an immediate capillary refill time to the digit. The patient tolerated the procedure well any complications. Post procedure instructions were discussed the patient for which he verbally understood. Follow-up in one week for nail check or sooner if any problems are to arise. Discussed signs/symptoms of infection and directed to call the office immediately should any occur or go directly to the emergency room. In the meantime, encouraged to call the office with any questions, concerns, changes symptoms. -Keflex  Vivi Barrack DPM

## 2022-08-28 IMAGING — CT CT HEAD W/O CM
4 series · 16 of 47 positions shown, 18 images · non-contrast
Comparison: None.

CLINICAL DATA: Syncopal episode

EXAM:
CT HEAD WITHOUT CONTRAST
CT CERVICAL SPINE WITHOUT CONTRAST
TECHNIQUE: Multidetector CT imaging of the head and cervical spine was
performed following the standard protocol without intravenous
contrast. Multiplanar CT image reconstructions of the cervical spine
were also generated.

[Series 3: head bone · axial · 0.41mm/px · z∈[+320,+348]mm · 3 of 71 slices shown]
[im 8/71  bone]
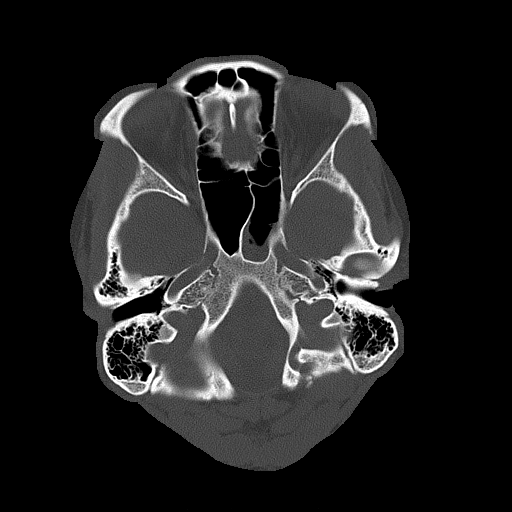
[im 15/71  bone]
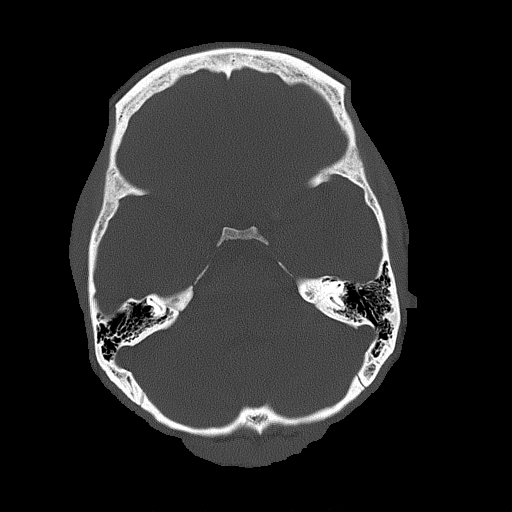
[im 22/71  bone]
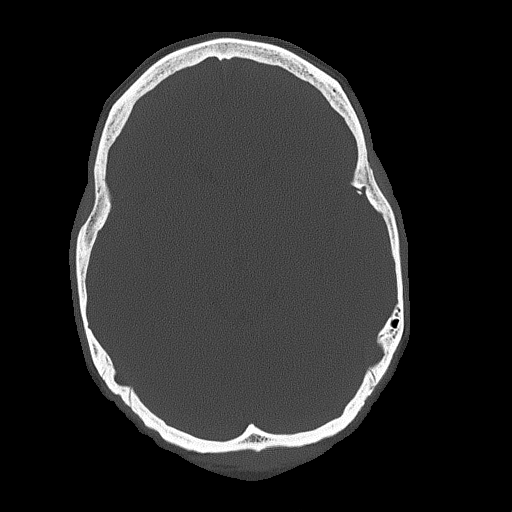

[Series 4: head without · axial · non-contrast · 0.41mm/px · z∈[+320,+426]mm · 7 of 29 slices shown, 9 images]
[im 4/29  brain]
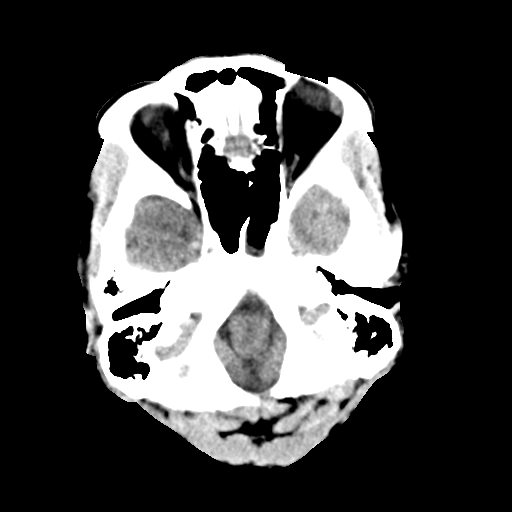
[im 4/29  bone]
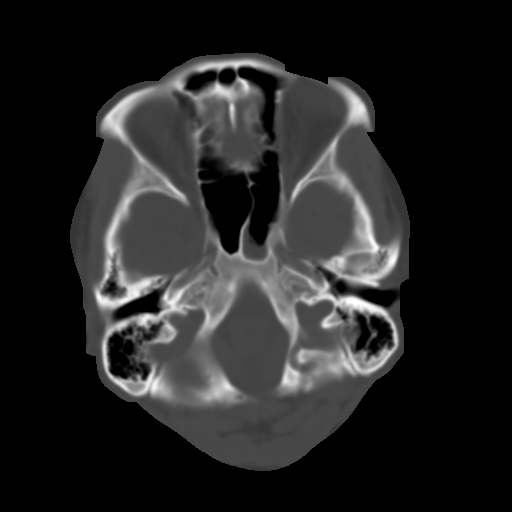
[im 8/29  brain]
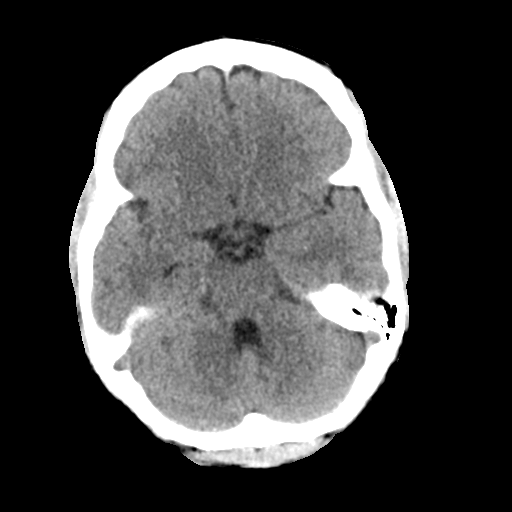
[im 11/29  brain]
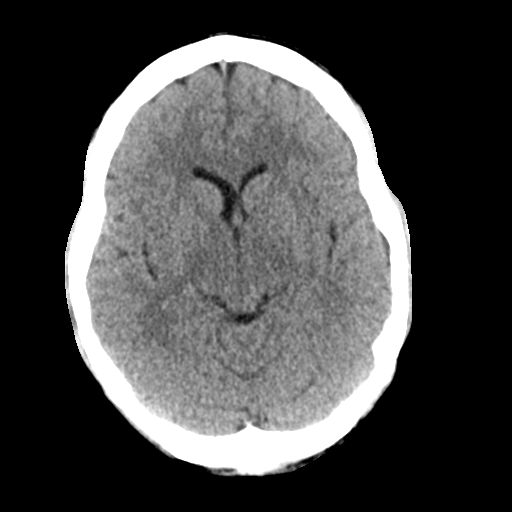
[im 15/29  brain]
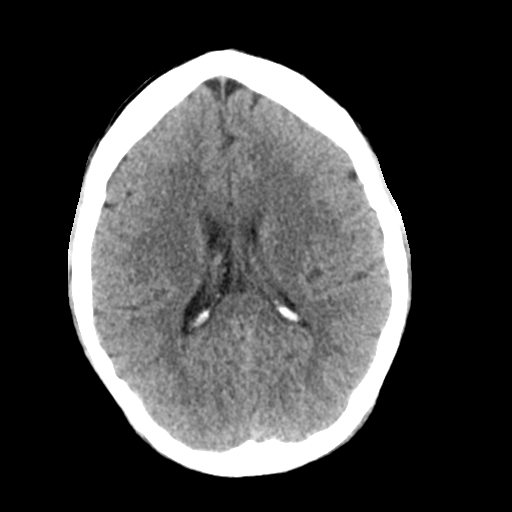
[im 18/29  brain]
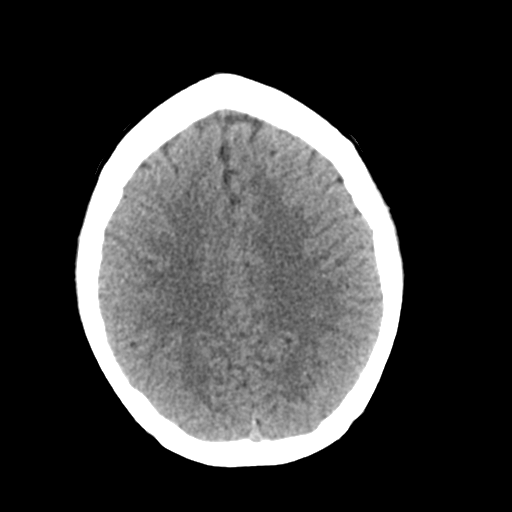
[im 18/29  bone]
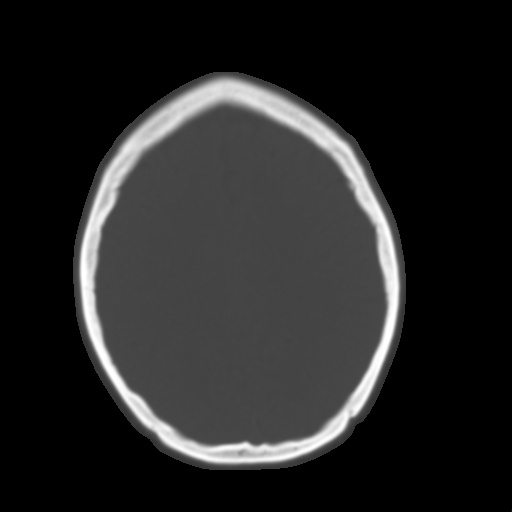
[im 22/29  brain]
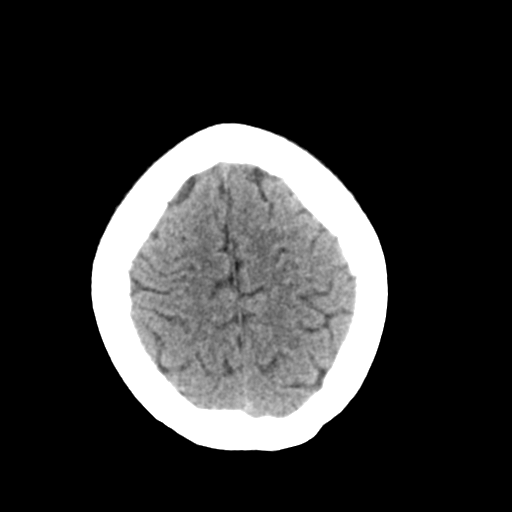
[im 25/29  brain]
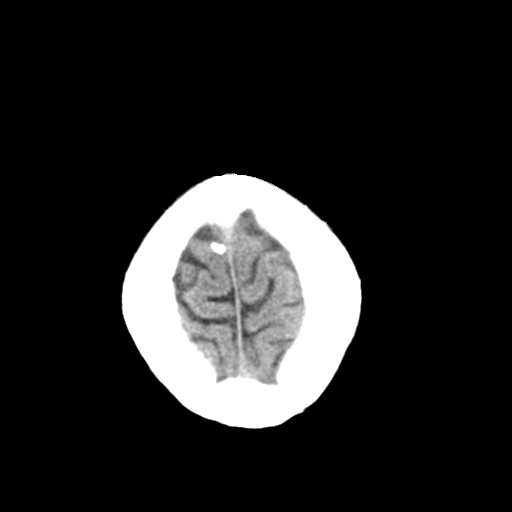

[Series 5: head without cor · coronal · non-contrast · 0.31mm/px · 3 of 65 slices shown]
[im 22/65  brain]
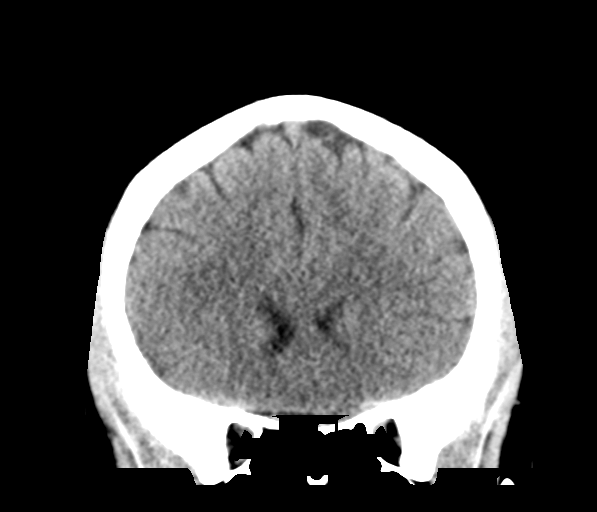
[im 29/65  brain]
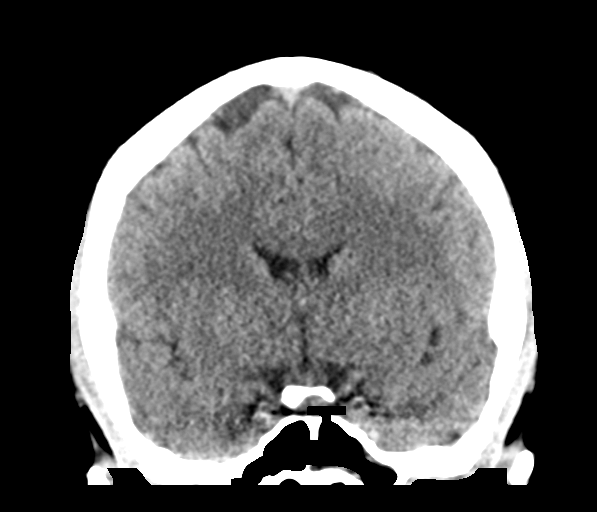
[im 36/65  brain]
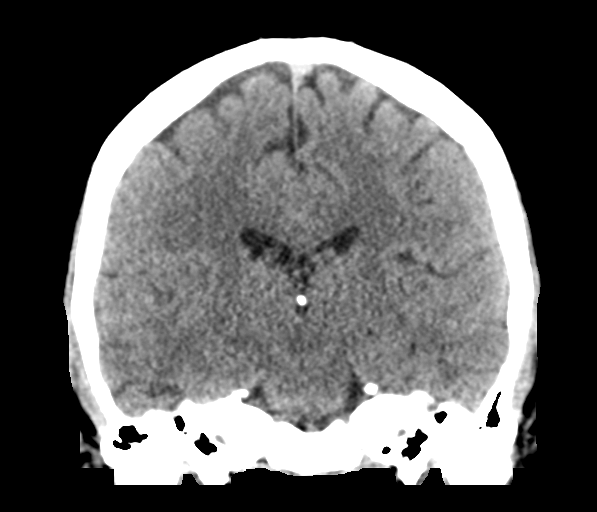

[Series 6: head without sag · sagittal · non-contrast · 0.31mm/px · 3 of 53 slices shown]
[im 18/53  brain]
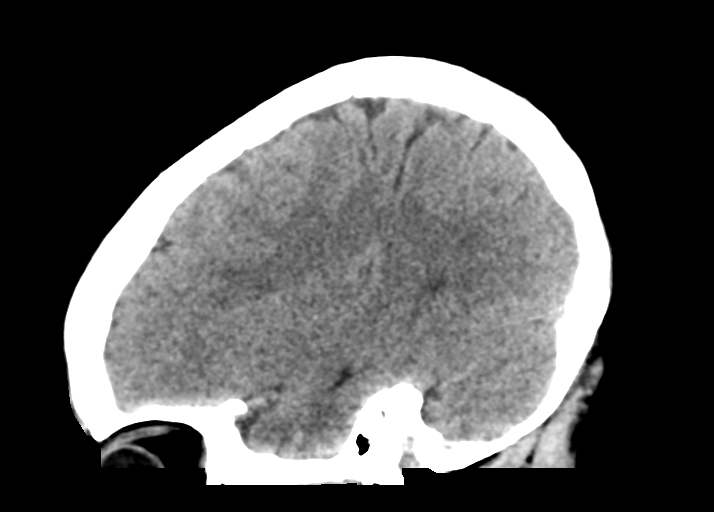
[im 27/53  brain]
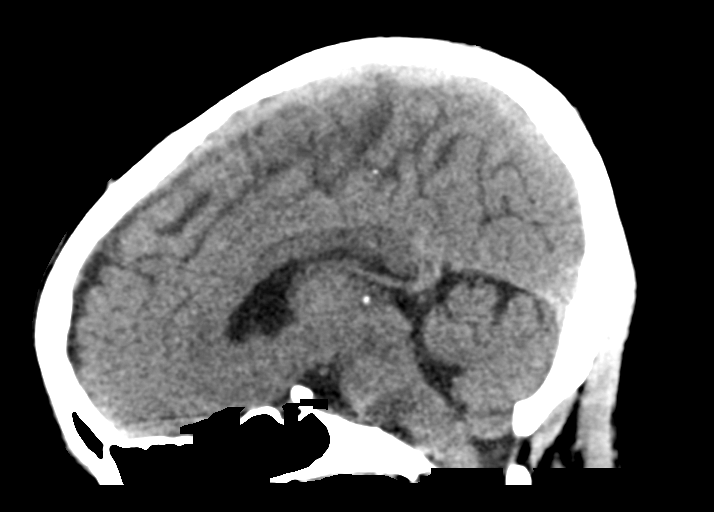
[im 35/53  brain]
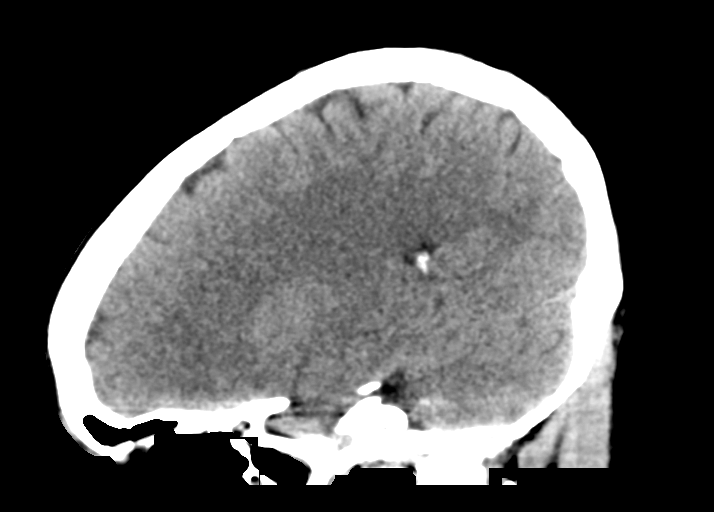

[16 of 47 positions shown; findings below may reference images not displayed]

FINDINGS: CT HEAD FINDINGS

Brain: No evidence of acute infarction, hemorrhage, hydrocephalus,
extra-axial collection or mass lesion/mass effect.

Vascular: No hyperdense vessel or unexpected calcification.

Skull: Normal. Negative for fracture or focal lesion.

Sinuses/Orbits: Mucosal thickening of the LEFT sphenoid sinus

Other: None.

CT CERVICAL SPINE FINDINGS

Alignment: Mild reversal of cervical lordosis. No evidence of
spondylolisthesis.

Skull base and vertebrae: No acute fracture. No primary bone lesion
or focal pathologic process.

Soft tissues and spinal canal: No prevertebral fluid or swelling. No
visible canal hematoma.

Disc levels:  No significant degenerative changes.

Upper chest: Negative.

Other: None
IMPRESSION: 1. No acute intracranial abnormality.
2. No acute fracture or subluxation of the cervical spine.

## 2022-11-26 DIAGNOSIS — F329 Major depressive disorder, single episode, unspecified: Secondary | ICD-10-CM | POA: Diagnosis not present

## 2022-11-26 DIAGNOSIS — R69 Illness, unspecified: Secondary | ICD-10-CM | POA: Diagnosis not present

## 2022-12-07 DIAGNOSIS — F329 Major depressive disorder, single episode, unspecified: Secondary | ICD-10-CM | POA: Diagnosis not present

## 2022-12-07 DIAGNOSIS — R69 Illness, unspecified: Secondary | ICD-10-CM | POA: Diagnosis not present

## 2023-01-11 DIAGNOSIS — F411 Generalized anxiety disorder: Secondary | ICD-10-CM | POA: Diagnosis not present

## 2023-01-11 DIAGNOSIS — F329 Major depressive disorder, single episode, unspecified: Secondary | ICD-10-CM | POA: Diagnosis not present

## 2023-01-17 DIAGNOSIS — F411 Generalized anxiety disorder: Secondary | ICD-10-CM | POA: Diagnosis not present

## 2023-01-17 DIAGNOSIS — F329 Major depressive disorder, single episode, unspecified: Secondary | ICD-10-CM | POA: Diagnosis not present

## 2023-01-31 DIAGNOSIS — F411 Generalized anxiety disorder: Secondary | ICD-10-CM | POA: Diagnosis not present

## 2023-01-31 DIAGNOSIS — F329 Major depressive disorder, single episode, unspecified: Secondary | ICD-10-CM | POA: Diagnosis not present

## 2023-02-07 DIAGNOSIS — F411 Generalized anxiety disorder: Secondary | ICD-10-CM | POA: Diagnosis not present

## 2023-02-07 DIAGNOSIS — F329 Major depressive disorder, single episode, unspecified: Secondary | ICD-10-CM | POA: Diagnosis not present

## 2023-02-15 DIAGNOSIS — F411 Generalized anxiety disorder: Secondary | ICD-10-CM | POA: Diagnosis not present

## 2023-02-15 DIAGNOSIS — F329 Major depressive disorder, single episode, unspecified: Secondary | ICD-10-CM | POA: Diagnosis not present

## 2023-03-01 DIAGNOSIS — F329 Major depressive disorder, single episode, unspecified: Secondary | ICD-10-CM | POA: Diagnosis not present

## 2023-03-01 DIAGNOSIS — F411 Generalized anxiety disorder: Secondary | ICD-10-CM | POA: Diagnosis not present

## 2023-03-29 DIAGNOSIS — F329 Major depressive disorder, single episode, unspecified: Secondary | ICD-10-CM | POA: Diagnosis not present

## 2023-03-29 DIAGNOSIS — F411 Generalized anxiety disorder: Secondary | ICD-10-CM | POA: Diagnosis not present

## 2023-03-31 DIAGNOSIS — F411 Generalized anxiety disorder: Secondary | ICD-10-CM | POA: Diagnosis not present

## 2023-03-31 DIAGNOSIS — F329 Major depressive disorder, single episode, unspecified: Secondary | ICD-10-CM | POA: Diagnosis not present

## 2023-04-26 DIAGNOSIS — F329 Major depressive disorder, single episode, unspecified: Secondary | ICD-10-CM | POA: Diagnosis not present

## 2023-04-26 DIAGNOSIS — F411 Generalized anxiety disorder: Secondary | ICD-10-CM | POA: Diagnosis not present

## 2023-05-11 DIAGNOSIS — F329 Major depressive disorder, single episode, unspecified: Secondary | ICD-10-CM | POA: Diagnosis not present

## 2023-05-11 DIAGNOSIS — F411 Generalized anxiety disorder: Secondary | ICD-10-CM | POA: Diagnosis not present

## 2023-05-30 DIAGNOSIS — F329 Major depressive disorder, single episode, unspecified: Secondary | ICD-10-CM | POA: Diagnosis not present

## 2023-05-30 DIAGNOSIS — F411 Generalized anxiety disorder: Secondary | ICD-10-CM | POA: Diagnosis not present

## 2023-06-14 DIAGNOSIS — F411 Generalized anxiety disorder: Secondary | ICD-10-CM | POA: Diagnosis not present

## 2023-06-14 DIAGNOSIS — F329 Major depressive disorder, single episode, unspecified: Secondary | ICD-10-CM | POA: Diagnosis not present

## 2023-06-15 DIAGNOSIS — M778 Other enthesopathies, not elsewhere classified: Secondary | ICD-10-CM | POA: Diagnosis not present

## 2023-06-23 DIAGNOSIS — Z113 Encounter for screening for infections with a predominantly sexual mode of transmission: Secondary | ICD-10-CM | POA: Diagnosis not present

## 2023-06-23 DIAGNOSIS — Z01419 Encounter for gynecological examination (general) (routine) without abnormal findings: Secondary | ICD-10-CM | POA: Diagnosis not present

## 2023-06-23 DIAGNOSIS — Z131 Encounter for screening for diabetes mellitus: Secondary | ICD-10-CM | POA: Diagnosis not present

## 2023-06-23 DIAGNOSIS — Z1329 Encounter for screening for other suspected endocrine disorder: Secondary | ICD-10-CM | POA: Diagnosis not present

## 2023-06-23 DIAGNOSIS — Z130 Encounter for screening for diseases of the blood and blood-forming organs and certain disorders involving the immune mechanism: Secondary | ICD-10-CM | POA: Diagnosis not present

## 2023-06-23 DIAGNOSIS — Z6824 Body mass index (BMI) 24.0-24.9, adult: Secondary | ICD-10-CM | POA: Diagnosis not present

## 2023-06-23 DIAGNOSIS — Z13220 Encounter for screening for lipoid disorders: Secondary | ICD-10-CM | POA: Diagnosis not present

## 2023-06-23 DIAGNOSIS — Z1321 Encounter for screening for nutritional disorder: Secondary | ICD-10-CM | POA: Diagnosis not present

## 2023-06-23 DIAGNOSIS — Z13228 Encounter for screening for other metabolic disorders: Secondary | ICD-10-CM | POA: Diagnosis not present

## 2023-06-28 DIAGNOSIS — M6281 Muscle weakness (generalized): Secondary | ICD-10-CM | POA: Diagnosis not present

## 2023-06-28 DIAGNOSIS — M79642 Pain in left hand: Secondary | ICD-10-CM | POA: Diagnosis not present

## 2023-06-28 DIAGNOSIS — M25532 Pain in left wrist: Secondary | ICD-10-CM | POA: Diagnosis not present

## 2023-06-28 DIAGNOSIS — M79641 Pain in right hand: Secondary | ICD-10-CM | POA: Diagnosis not present

## 2023-06-28 DIAGNOSIS — M25531 Pain in right wrist: Secondary | ICD-10-CM | POA: Diagnosis not present

## 2023-07-11 DIAGNOSIS — F329 Major depressive disorder, single episode, unspecified: Secondary | ICD-10-CM | POA: Diagnosis not present

## 2023-07-11 DIAGNOSIS — F411 Generalized anxiety disorder: Secondary | ICD-10-CM | POA: Diagnosis not present

## 2023-07-21 DIAGNOSIS — M79641 Pain in right hand: Secondary | ICD-10-CM | POA: Diagnosis not present

## 2023-07-21 DIAGNOSIS — M79642 Pain in left hand: Secondary | ICD-10-CM | POA: Diagnosis not present

## 2023-07-26 DIAGNOSIS — F411 Generalized anxiety disorder: Secondary | ICD-10-CM | POA: Diagnosis not present

## 2023-07-26 DIAGNOSIS — F329 Major depressive disorder, single episode, unspecified: Secondary | ICD-10-CM | POA: Diagnosis not present

## 2023-08-08 DIAGNOSIS — F411 Generalized anxiety disorder: Secondary | ICD-10-CM | POA: Diagnosis not present

## 2023-08-08 DIAGNOSIS — F329 Major depressive disorder, single episode, unspecified: Secondary | ICD-10-CM | POA: Diagnosis not present

## 2023-08-23 DIAGNOSIS — F329 Major depressive disorder, single episode, unspecified: Secondary | ICD-10-CM | POA: Diagnosis not present

## 2023-08-23 DIAGNOSIS — F411 Generalized anxiety disorder: Secondary | ICD-10-CM | POA: Diagnosis not present

## 2023-09-07 DIAGNOSIS — M79641 Pain in right hand: Secondary | ICD-10-CM | POA: Diagnosis not present

## 2023-09-07 DIAGNOSIS — M654 Radial styloid tenosynovitis [de Quervain]: Secondary | ICD-10-CM | POA: Diagnosis not present

## 2023-09-07 DIAGNOSIS — M79642 Pain in left hand: Secondary | ICD-10-CM | POA: Diagnosis not present

## 2023-10-25 DIAGNOSIS — F411 Generalized anxiety disorder: Secondary | ICD-10-CM | POA: Diagnosis not present

## 2023-10-25 DIAGNOSIS — F329 Major depressive disorder, single episode, unspecified: Secondary | ICD-10-CM | POA: Diagnosis not present

## 2024-07-04 DIAGNOSIS — Z113 Encounter for screening for infections with a predominantly sexual mode of transmission: Secondary | ICD-10-CM | POA: Diagnosis not present

## 2024-07-04 DIAGNOSIS — Z130 Encounter for screening for diseases of the blood and blood-forming organs and certain disorders involving the immune mechanism: Secondary | ICD-10-CM | POA: Diagnosis not present

## 2024-07-04 DIAGNOSIS — Z13220 Encounter for screening for lipoid disorders: Secondary | ICD-10-CM | POA: Diagnosis not present

## 2024-07-04 DIAGNOSIS — Z13228 Encounter for screening for other metabolic disorders: Secondary | ICD-10-CM | POA: Diagnosis not present

## 2024-07-04 DIAGNOSIS — Z131 Encounter for screening for diabetes mellitus: Secondary | ICD-10-CM | POA: Diagnosis not present

## 2024-07-04 DIAGNOSIS — Z1321 Encounter for screening for nutritional disorder: Secondary | ICD-10-CM | POA: Diagnosis not present

## 2024-07-04 DIAGNOSIS — Z1329 Encounter for screening for other suspected endocrine disorder: Secondary | ICD-10-CM | POA: Diagnosis not present

## 2024-07-04 DIAGNOSIS — Z124 Encounter for screening for malignant neoplasm of cervix: Secondary | ICD-10-CM | POA: Diagnosis not present

## 2024-07-15 DIAGNOSIS — Z6827 Body mass index (BMI) 27.0-27.9, adult: Secondary | ICD-10-CM | POA: Diagnosis not present

## 2024-07-15 DIAGNOSIS — H66002 Acute suppurative otitis media without spontaneous rupture of ear drum, left ear: Secondary | ICD-10-CM | POA: Diagnosis not present

## 2024-07-15 DIAGNOSIS — J029 Acute pharyngitis, unspecified: Secondary | ICD-10-CM | POA: Diagnosis not present
